# Patient Record
Sex: Male | Born: 1975 | Race: White | Hispanic: No | Marital: Single | Smoking: Never smoker
Health system: Southern US, Academic
[De-identification: ages and names within clinical notes are randomized; demographics above are authoritative.]

## PROBLEM LIST (undated history)

## (undated) DIAGNOSIS — E7800 Pure hypercholesterolemia, unspecified: Secondary | ICD-10-CM

## (undated) DIAGNOSIS — I10 Essential (primary) hypertension: Secondary | ICD-10-CM

## (undated) DIAGNOSIS — J189 Pneumonia, unspecified organism: Secondary | ICD-10-CM

## (undated) DIAGNOSIS — K7290 Hepatic failure, unspecified without coma: Secondary | ICD-10-CM

## (undated) HISTORY — DX: Pure hypercholesterolemia, unspecified: E78.00

## (undated) HISTORY — PX: HX CARPAL TUNNEL RELEASE: SHX101

## (undated) HISTORY — DX: Hepatic failure, unspecified without coma: K72.90

## (undated) HISTORY — PX: HX OTHER: 2100001105

## (undated) HISTORY — DX: Essential (primary) hypertension: I10

## (undated) HISTORY — DX: Pneumonia, unspecified organism: J18.9

## (undated) HISTORY — PX: HX EXPOSURE TO METAL SHAVINGS: 2100001401

---

## 2010-09-04 ENCOUNTER — Emergency Department (HOSPITAL_COMMUNITY): Admission: EM | Admit: 2010-09-04 | Disposition: A | Discharge status: Home / Self Care | Payer: BC Managed Care – PPO

## 2010-09-04 ENCOUNTER — Emergency Department (EMERGENCY_DEPARTMENT_HOSPITAL): Payer: BC Managed Care – PPO

## 2011-06-12 ENCOUNTER — Emergency Department (EMERGENCY_DEPARTMENT_HOSPITAL): Payer: BC Managed Care – PPO

## 2011-06-12 ENCOUNTER — Emergency Department (EMERGENCY_DEPARTMENT_HOSPITAL)
Admission: EM | Admit: 2011-06-12 | Discharge: 2011-06-12 | Payer: BC Managed Care – PPO | Attending: NURSE PRACTITIONER | Admitting: NURSE PRACTITIONER

## 2011-06-15 ENCOUNTER — Emergency Department (EMERGENCY_DEPARTMENT_HOSPITAL): Admission: EM | Admit: 2011-06-15 | Discharge: 2011-06-15 | Payer: BC Managed Care – PPO

## 2011-06-15 ENCOUNTER — Emergency Department (EMERGENCY_DEPARTMENT_HOSPITAL): Payer: BC Managed Care – PPO

## 2011-08-06 ENCOUNTER — Emergency Department (EMERGENCY_DEPARTMENT_HOSPITAL): Admission: EM | Admit: 2011-08-06 | Discharge: 2011-08-06 | Payer: BC Managed Care – PPO

## 2011-08-06 ENCOUNTER — Emergency Department (EMERGENCY_DEPARTMENT_HOSPITAL): Payer: BC Managed Care – PPO

## 2011-08-21 ENCOUNTER — Encounter (INDEPENDENT_AMBULATORY_CARE_PROVIDER_SITE_OTHER): Payer: Self-pay | Admitting: Neurological Surgery

## 2011-08-29 ENCOUNTER — Encounter (INDEPENDENT_AMBULATORY_CARE_PROVIDER_SITE_OTHER): Payer: Self-pay | Admitting: Neurological Surgery

## 2013-02-12 ENCOUNTER — Ambulatory Visit (INDEPENDENT_AMBULATORY_CARE_PROVIDER_SITE_OTHER): Payer: BC Managed Care – PPO | Admitting: Neurological Surgery

## 2013-02-12 ENCOUNTER — Encounter (INDEPENDENT_AMBULATORY_CARE_PROVIDER_SITE_OTHER): Payer: Self-pay | Admitting: Neurological Surgery

## 2013-02-12 VITALS — BP 138/86 | HR 60 | Temp 97.8°F | Resp 16 | Ht 72.0 in | Wt 235.0 lb

## 2013-02-12 DIAGNOSIS — G8929 Other chronic pain: Secondary | ICD-10-CM

## 2013-02-12 DIAGNOSIS — M545 Low back pain, unspecified: Secondary | ICD-10-CM

## 2013-02-12 DIAGNOSIS — H579 Unspecified disorder of eye and adnexa: Secondary | ICD-10-CM

## 2013-02-12 DIAGNOSIS — R200 Anesthesia of skin: Secondary | ICD-10-CM

## 2013-02-12 DIAGNOSIS — R51 Headache: Secondary | ICD-10-CM

## 2013-02-12 DIAGNOSIS — R209 Unspecified disturbances of skin sensation: Secondary | ICD-10-CM

## 2013-02-12 DIAGNOSIS — R519 Headache, unspecified: Secondary | ICD-10-CM

## 2013-02-12 DIAGNOSIS — M542 Cervicalgia: Secondary | ICD-10-CM

## 2013-02-12 DIAGNOSIS — G950 Syringomyelia and syringobulbia: Secondary | ICD-10-CM

## 2013-02-12 NOTE — H&P (Addendum)
[place] Department of Neurosurgery  New Outpatient/Consult    [name]  Date of Service: 02/12/2013  Referring physician: Max Fickle, MD  [facility]  [address]  [place], [place] [address]      Chief Complaint:   Chief Complaint   Patient presents with    Back Pain    Neck Pain    Leg Pain     bilat    Arm Pain     bilat    Numbness/Tingling Of Leg     bilat     History is provided by patient    History of Present Illness  Mr. Riley Wood is a pleasant 37 y.o. male with a past medical history of HTN, hyperlipidemia, history of liver failure in 2004 who presents today with complaints of entire spine pain since 2013, but more bothersome is recent bilateral upper and lower extremity numbness. He was found to have a syrinx in June 2013 on imaging and was recommended to follow up with repeat imaging in 4 months. The patient never followed up. He now developed the extremity numbness. Symptoms are worse on the right, and with standing or sitting. He does have lumbar pain radiating to his posterior thighs and constant burning cervicothoracic pain radiating to his interscapular region. Patient also c/o recent headaches with associated eye pain and pressure. Denies blurred vision, memory loss, confusion or disorientation. Also denies nausea or vomiting. He is having a significant amount of trouble sleeping which he is very concerned about. He takes Neurontin and motrin 800 mg for pain. He was tried on percocet and hydrocodone but states they caused side effects and were ineffective. Reports normal bowel and bladder function. Reports occasional gait instability.       Past History  Current Outpatient Prescriptions   Medication Sig    aspirin (ECOTRIN) 81 mg Oral Tablet, Delayed Release (E.C.) Take 81 mg by mouth Once a day    Gabapentin (NEURONTIN) 600 mg Oral Tablet Take 600 mg by mouth Three times a day    Ibuprofen (MOTRIN) 800 mg Oral Tablet Take 800 mg by mouth Three  times a day as needed for Pain    lisinopril (PRINIVIL) 20 mg Oral Tablet Take 20 mg by mouth Once a day    rosuvastatin (CRESTOR) 10 mg Oral Tablet Take 10 mg by mouth Once a day     No Known Allergies  Past Medical History   Diagnosis Date    High cholesterol     Hypertension     Pneumonia     Liver failure      Past Surgical History   Procedure Laterality Date    Hx carpal tunnel release      Hx exposure to metal shavings         Family History  Family History   Problem Relation Age of Onset    Cancer Multiple family members     Hypertension Multiple family members     Arthritis-osteo Father        Social History  History     Social History    Marital Status: Divorced     Spouse Name: N/A     Number of Children: N/A    Years of Education: N/A     Occupational History          Social History Main Topics    Smoking status: Never Smoker     Smokeless tobacco: Current User    Alcohol Use: Yes  Drug Use: No    Sexual Activity: Not on file     Other Topics Concern    Right Hand Dominant Yes     Social History Narrative    No narrative on file       Review of Systems:  + extremity numbness. + headaches. + neck pain. + thoracic pain. + lumbar pain. Negative for fever, weight loss, syncope, myalgias, chest pain, shortness of breath, abdominal pain, bowel or bladder incontinence, weakness and gait disturbance. All other systems reviewed are negative.     Physical Examination:   BP 138/86   Pulse 60   Temp(Src) 36.6 C (97.8 F) (Tympanic)   Resp 16   Ht 1.829 m (6')   Wt 106.595 kg (235 lb)   BMI 31.86 kg/m2  General: Patient appears stated age and in no apparent distress. Vital signs are stable. Afebrile.  Head: normocephalic, atraumatic   Eyes: Sclera non-icteric. EOMI intact  Neck: supple, no obvious masses. No JVD. No lesions.   Peripheral Vascular System: Radial pulses intact, 2/4.   Respiratory: Symmetric rise and fall of chest. No accessory muscle use.   Musculoskeletal: SLR negative  bilaterally; Patrick's sign negative bilaterally;  Lhermitte's sign negative   Neuro: Alert &  Oriented x 4. Cranial nerves grossly intact: extraocular muscles are intact, speech and language are normal and appropriate, tongue is midline and shoulders elevate symmetrically. Hearing is grossly intact. Short term, long term memory, attention span and concentration were all grossly normal.  Motor examination is 5/5 in bilateral upper extremities and 5/5 in bilateral lower extremities. Fine touch sensation is diminished in right lower and upper extremity and right foot.  DTR are 2+ and symmetric in upper and lower extremities with downgoing toes. No clonus. Negative Hoffman bilaterally.  Gait is normal. Negative babinski. Tandem is normal.      Imaging:  I reviewed the film myself and the radiology report, my interpretation is as follows: Thoracic MRI w/o contrast 08/08/11- lower cervical upper thoracic syrinx of cord  Cervical MRI with and without contrast 08/07/11- syrinx C7-T1.   MRI Brain 08/08/11 -unremarkable.     Assessment:    ICD-9-CM    1. 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 Bilateral leg numbness 782.0 MRI BRAIN W/WO CONTRAST     MRI SPINE THORACIC W/WO CONTRAST     MRI SPINE LUMBOSACRAL W/WO CONTRAST     NCS/EMG     MRI BRAIN W/WO CONTRAST     MRI SPINE THORACIC W/WO CONTRAST     MRI SPINE LUMBOSACRAL W/WO CONTRAST   3. Arm numbness bilaterally 782.0 MRI BRAIN W/WO CONTRAST     MRI SPINE CERVICAL W/WO CONTRAST     MRI SPINE THORACIC W/WO CONTRAST     NCS/EMG     MRI BRAIN W/WO CONTRAST     MRI SPINE CERVICAL W/WO CONTRAST     MRI SPINE THORACIC W/WO CONTRAST   4. Chronic lumbar pain 724.2 MRI SPINE LUMBOSACRAL W/WO CONTRAST     NCS/EMG      MRI SPINE LUMBOSACRAL W/WO CONTRAST   5. Neck pain 723.1  MRI SPINE CERVICAL W/WO CONTRAST     NCS/EMG     MRI SPINE CERVICAL W/WO CONTRAST   6. Generalized headaches 784.0 MRI BRAIN W/WO CONTRAST     MRI SPINE CERVICAL W/WO CONTRAST     MRI BRAIN W/WO CONTRAST     MRI SPINE CERVICAL W/WO CONTRAST   7. Eye pressure 379.99 MRI BRAIN W/WO CONTRAST     MRI BRAIN W/WO CONTRAST         Plan:  We need to evaluate the syrinx of his spinal cord. Also need to evaluate further for complaints of headaches, we well as bilateral upper and lower extremity numbness. Spinal cord compression is also a concern.   1. MRI Brain with and without contrast  2. MRI Cervical with and without contrast  3. MRI Thoracic with and w/o contrast  4. MRI Lumbar with and without contrast  5. EMG/NCV bilateral upper and lower extremities  6. Follow up with MRIs and EMG      Domingo Pulse, PA 02/12/2013, 1:12 PM      Patient was evaluated independently, discussed with Dr. Riley Lam whom recommends the plan above.

## 2013-02-13 ENCOUNTER — Encounter (INDEPENDENT_AMBULATORY_CARE_PROVIDER_SITE_OTHER): Payer: Self-pay | Admitting: Neurological Surgery

## 2013-02-16 ENCOUNTER — Ambulatory Visit (INDEPENDENT_AMBULATORY_CARE_PROVIDER_SITE_OTHER): Payer: Self-pay | Admitting: Neurological Surgery

## 2013-02-16 NOTE — Telephone Encounter (Signed)
 BC/BS anthem TERMINATED on 11.30.14   ezm:zmpw$MzfnczAzqnmzIZPI_zjPBogGNIeGPNDyTNjdesWpSpLliYxSz$$MzfnczAzqnmzIZPI_zjPBogGNIeGPNDyTNjdesWpSpLliYxSz$ :45 #[phone] with bc/bs  CONTACTED PATIENT will fax copy of [employer] card  Riley Wood 02/16/2013, 1:49 PM

## 2013-02-18 ENCOUNTER — Ambulatory Visit (INDEPENDENT_AMBULATORY_CARE_PROVIDER_SITE_OTHER): Payer: Self-pay | Admitting: Neurological Surgery

## 2013-02-18 NOTE — Telephone Encounter (Signed)
 Returned patient voice mail in reference to his paperwork for [employer].  I called [facility] [phone] and spoke to [place] who advised me he is still pending until he completes and sends in his paperwork along with payment.  It can take up to a week to reinstate his insurance.  Any further questions he will need to contact [employer].  Ellouise JONELLE Bruch 02/18/2013, 10:50 AM

## 2013-03-09 ENCOUNTER — Ambulatory Visit (INDEPENDENT_AMBULATORY_CARE_PROVIDER_SITE_OTHER): Payer: Self-pay | Admitting: Neurological Surgery

## 2013-03-09 NOTE — Telephone Encounter (Signed)
MRI Brain/Cervical/Thoracic/Lumbar were denied by insurance.  Reason: no new symptoms and no symptoms of Chairi.  (information was given about recent weakness/numbness & headaches)  Sent note to PA for further instruction.  Patient also notified and is contacting insurance company.  #[phone] for peer to peer  ID # [id number]

## 2013-03-13 ENCOUNTER — Encounter (INDEPENDENT_AMBULATORY_CARE_PROVIDER_SITE_OTHER): Payer: Self-pay | Admitting: Neurological Surgery

## 2013-03-13 ENCOUNTER — Ambulatory Visit (INDEPENDENT_AMBULATORY_CARE_PROVIDER_SITE_OTHER): Payer: Self-pay | Admitting: Neurological Surgery

## 2013-03-13 ENCOUNTER — Other Ambulatory Visit (INDEPENDENT_AMBULATORY_CARE_PROVIDER_SITE_OTHER): Payer: Self-pay | Admitting: Physician Assistant

## 2013-03-13 DIAGNOSIS — M545 Low back pain, unspecified: Secondary | ICD-10-CM

## 2013-03-13 DIAGNOSIS — R51 Headache: Secondary | ICD-10-CM

## 2013-03-13 DIAGNOSIS — G950 Syringomyelia and syringobulbia: Secondary | ICD-10-CM

## 2013-03-13 DIAGNOSIS — R519 Headache, unspecified: Secondary | ICD-10-CM

## 2013-03-13 DIAGNOSIS — M79604 Pain in right leg: Secondary | ICD-10-CM

## 2013-03-13 DIAGNOSIS — M79605 Pain in left leg: Secondary | ICD-10-CM

## 2013-03-13 DIAGNOSIS — Z77018 Contact with and (suspected) exposure to other hazardous metals: Secondary | ICD-10-CM

## 2013-03-13 NOTE — Telephone Encounter (Signed)
EMG scheduled with Dr. Eino Farbernavada on 1.30.15 @ 10:00 a.m. Patient to arrive 15 minutes early with medication list, insurance cards, & I.D. Waiting on further instruction regarding patient following up with Dr. Riley Lamouglas. Patient's MRI's were denied. Message sent to PA-C. Patient verbalized understanding. [name], MA 03/13/2013, 9:21 AM

## 2013-03-13 NOTE — Progress Notes (Addendum)
Performed peer to peer.  She approved a CERVICOTHORACIC JUNCTION MRI . Code # [id number]   Approval # [id number]    The rest is denied. I will also refer him to neurology for headaches. And he needs PT for lumbar before they will consider MRI Lumbar.     Follow up with MRI, EMG, needs to have completed some PT as well.    Domingo PulseHeather Johari Bennetts, PA 03/13/2013, 11:22 AM

## 2013-03-13 NOTE — Telephone Encounter (Signed)
MRI Cervicothoracic & XR Scheduled 1.11.15 @ 7:30 p.m.Marland Kitchen. Patient to arrive @ 6:30 p.m.Marland Kitchen. [facility] Spoke with Arline Aspindy When scheduling test. Will need to speak with Beckie BusingHeather PA-C prior to faxing referral for Neurology consult. Follow-up appointment will be scheduled after appointment for Neuro consult is scheduled. Patient verbalized understanding. [name], MA 03/13/2013, 1:35 PM

## 2013-03-17 ENCOUNTER — Ambulatory Visit (INDEPENDENT_AMBULATORY_CARE_PROVIDER_SITE_OTHER): Payer: Self-pay | Admitting: Neurological Surgery

## 2013-03-17 NOTE — Telephone Encounter (Signed)
Mailed patient PT slip and list of locations to have PT completed. Informed the patient that he needs to complete 4-6 weeks of PT prior to having his MRI L-Spine approved. Informed patient to have physical therapist fax his office notes to Dr. Riley Lamouglas' office at [phone]. Referral faxed to Dr. Eino FarberNavada for consult/evaluation of headaches, eye pain/pressure on 1.13.15. Riley Etienneachelle D Powell, MA 03/17/2013, 9:44 AM

## 2013-03-17 NOTE — Telephone Encounter (Signed)
Consult scheduled with Dr. Eino FarberNavada on 1.21.15 @ 8:00 a.m.Marland Kitchen. for headaches & eye pain/pressure. Patient to arrive 15 minutes early with a medication list, insurance cards, & I.D.. Riley EtienneRachelle D Powell, MA 03/17/2013, 11:23 AM

## 2013-05-05 ENCOUNTER — Ambulatory Visit (INDEPENDENT_AMBULATORY_CARE_PROVIDER_SITE_OTHER): Payer: BC Managed Care – PPO | Admitting: Neurological Surgery

## 2013-05-05 ENCOUNTER — Encounter (INDEPENDENT_AMBULATORY_CARE_PROVIDER_SITE_OTHER): Payer: Self-pay | Admitting: Neurological Surgery

## 2013-05-05 VITALS — BP 130/82 | HR 68 | Temp 98.0°F | Resp 16 | Ht 72.0 in | Wt 235.0 lb

## 2013-05-05 DIAGNOSIS — G950 Syringomyelia and syringobulbia: Secondary | ICD-10-CM

## 2013-05-05 DIAGNOSIS — M545 Low back pain, unspecified: Secondary | ICD-10-CM

## 2013-05-05 DIAGNOSIS — M546 Pain in thoracic spine: Secondary | ICD-10-CM

## 2013-05-05 DIAGNOSIS — M542 Cervicalgia: Secondary | ICD-10-CM

## 2013-05-05 NOTE — Progress Notes (Addendum)
[place] Department of Neurosurgery    [name]  Date of Service: 05/05/2013      Subjective:  Riley Wood is a pleasant 38 y.o. Male that is RH, single and between jobs currently with a past medical history of HTN, hyperlipidemia, history of liver failure in 2004 who presents today for review diagnostic studies. Entire spine pain initially started in 2013 and is progressively worsening. He was found to have small syrinx at C7-T1 in 2013. He is here for follow up on syrinx. He continues to have chronic cervicothoracic that radiates to the bilateral suprascapular regions and shoulders with pain. He also has entire hand numbness, right greater than left. He has a history of left carpal tunnel release in 2012. He also reports lumbar pain that radiates to the right hip, groin, knee and anterior right thigh with pain. He has waist down numbness after sitting on hard surfaces such as bleachers. Reports normal bowel and bladder function. He last completed PT in 2014, nothing recently. Does not follow with pain management. He follows with Dr Eino Farber, saw him this morning, he has him on amitriptyline. He also take ibuprofen 800mg  and Voltaren.     Review of Systems:  Negative for fever, weight loss, myalgias, syncope, chest pain, shortness of breath, abdominal pain, bowel or bladder incontinence, weakness and gait disturbance. + NP, thoracic pain, shoulder pain, hand numbness, LBP, RLE pain, LE numbness    Objective:  Filed Vitals:    05/05/13 1305   BP: 130/82   Pulse: 68   Temp: 36.7 C (98 F)   TempSrc: Tympanic   Resp: 16   Height: 1.829 m (6')   Weight: 106.595 kg (235 lb)     General: Patient appears stated age, in no acute distress.  Respiratory: Symmetric rise and fall of chest. No accessory muscle use.   Musculoskeletal:+ right SLR. Negative Patrick's sign  Neurological: A&O x 3. Cranial nerves 2-12 grossly intact:  extraocular muscles are intact, speech and language are normal and appropriate, tongue is  midline and shoulders elevate symmetrically. Hearing is grossly intact. Motor exam: 5/5 and symmetric in upper and lower extremities (give away weakness globally). DTRs 2+ and symmetric in upper and lower extremities, with downgoing toes. No clonus. Negative Hoffman's sign bilaterally. Sensation is diminished over RLE. + tinel at the left carpal and right cubital.       Imaging:  I personally reviewed this film and radiology report, my interpretation is as follows:  MRI thoracic 03/15/13: Small syrinx again seen at the C7-T1 level as described on the previous exam. Otherwise negative MRI of the thoracic spine. No interval change since the previous exam of August 08, 2011.  MRI cervical 03/15/13: Small syrinx at the C7-T1 level unchanged in appearance since the prior exam. Multilevel mild diffuse bulging discs extending from C3-C7. No focal disc herniation.  EMG 4 ext 04/03/13 Dr Eino Farber: mild to moderate R carpal tunnel syndrome. No cervical or lumbar radiculopathy     Assessment:    ICD-9-CM    1. 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 Cervical pain 723.1 AMB CONSULT/REFERRAL [place]     AMB CONSULT/REFERRAL PHYSIATRY-BRGPT   3. Thoracic back pain 724.1 AMB CONSULT/REFERRAL [place]     AMB CONSULT/REFERRAL PHYSIATRY-BRGPT   4. Lumbar pain with radiation down right leg 724.2 AMB CONSULT/REFERRAL [place]     AMB CONSULT/REFERRAL PHYSIATRY-BRGPT  Impression: stable syrinx at C7-T1    Plan:  [facility]   2. Referral to Physiatry, Dr Arrie AranBiundo  3. Recommend he continue follow up with Dr Eino FarberNavada  4. Return to clinic prn basis. Syrinx is stable on follow up scans.     Patient was evaluated with Dr.Douglas who recommends the plan above.    Lorann Tani Loletha Grayerlise Rusilko, PA-C 05/05/2013, 13:34  I personally saw and examined the patient. See [facility]'s note for additional details. No surgical  recommendations at this time.     Johna Rolesichard Allen Douglas, MD 05/10/2013, 16:52

## 2013-05-13 ENCOUNTER — Encounter (INDEPENDENT_AMBULATORY_CARE_PROVIDER_SITE_OTHER): Payer: Self-pay | Admitting: ANESTHESIOLOGY

## 2013-05-13 NOTE — Progress Notes (Signed)
I called patient to remind them to make sure their new patient packet paperwork is completely filled out prior to the visit. There wasn't an answer I left a message only stating that patient needs to have the four page paperwork complete prior to the scheduled clinic visit. If patient did not receive it via mail or needs help filling it out to arrive thirty minutes early or earlier to get it completed.   Riley [name]  05/13/2013, 10:54

## 2013-05-14 ENCOUNTER — Encounter (INDEPENDENT_AMBULATORY_CARE_PROVIDER_SITE_OTHER): Payer: Self-pay | Admitting: ANESTHESIOLOGY

## 2013-05-14 ENCOUNTER — Ambulatory Visit (INDEPENDENT_AMBULATORY_CARE_PROVIDER_SITE_OTHER): Payer: BC Managed Care – PPO | Admitting: ANESTHESIOLOGY

## 2013-05-14 VITALS — BP 138/94 | HR 67 | Temp 97.8°F | Resp 14 | Ht 72.0 in | Wt 240.8 lb

## 2013-05-14 DIAGNOSIS — M545 Low back pain, unspecified: Secondary | ICD-10-CM

## 2013-05-14 DIAGNOSIS — J189 Pneumonia, unspecified organism: Secondary | ICD-10-CM

## 2013-05-14 DIAGNOSIS — M542 Cervicalgia: Secondary | ICD-10-CM

## 2013-05-14 DIAGNOSIS — IMO0001 Reserved for inherently not codable concepts without codable children: Secondary | ICD-10-CM

## 2013-05-14 DIAGNOSIS — G950 Syringomyelia and syringobulbia: Secondary | ICD-10-CM

## 2013-05-14 DIAGNOSIS — M5030 Other cervical disc degeneration, unspecified cervical region: Secondary | ICD-10-CM

## 2013-05-14 DIAGNOSIS — K7290 Hepatic failure, unspecified without coma: Secondary | ICD-10-CM | POA: Insufficient documentation

## 2013-05-14 DIAGNOSIS — I10 Essential (primary) hypertension: Secondary | ICD-10-CM

## 2013-05-14 DIAGNOSIS — E7800 Pure hypercholesterolemia, unspecified: Secondary | ICD-10-CM

## 2013-05-14 DIAGNOSIS — K769 Liver disease, unspecified: Secondary | ICD-10-CM

## 2013-05-14 DIAGNOSIS — M546 Pain in thoracic spine: Secondary | ICD-10-CM

## 2013-05-14 DIAGNOSIS — M7918 Myalgia, other site: Secondary | ICD-10-CM

## 2013-05-14 MED ORDER — METHOCARBAMOL 750 MG TABLET
750.00 mg | ORAL_TABLET | Freq: Three times a day (TID) | ORAL | Status: AC | PRN
Start: 2013-05-14 — End: ?

## 2013-05-14 NOTE — Patient Instructions (Signed)
HOSPITAL PRE PROCEDURE INSTRUCTIONS    PATIENT NAME: Riley Wood   DATE SCHEDULED:   TIME TO REPORT TO [facility] (2ND FLOOR-OUT -PATIENT SURGERY):     ###YOU WILL NEED SOMEONE TO DRIVE YOU HOME FOLLOWING YOUR                                                         PROCEDURE###    PRIOR TO PROCEDURE:    Nothing to eat or drink for five (5) hours prior to procedure.    Bring a list of medications with you to the hospital.    Take medications as usual with the exception of any and all blood thinners and anti-inflammatory medications. (Check your medications).    Notify your doctor's office if any changes develop, such as temperature elevation, nausea, vomiting, diarrhea or infection. You cannot have the procedure if you are on an antibiotic.    If you need to cancel your procedure, please call [phone] between 8:30 a.m. And 4:00 p.m.    Since your procedure will be performed in an "operating room-like setting, family members will not be permitted to accompany you during your procedure.    AFTER YOUR PROCEDURE:  Call your physician's office if any problem develops. Call for temperature greater than 101 degrees.    You may experience weakness, tingling or heavy feelings in your legs the first few hours after your procedure, requiring you to be cautious when ambulating.    You may also experience a temporary increase in the level of your pain.    Your FOLLOW-UP OFFICE appointment is:Marland Kitchen.  (If for any reason you must cancel your procedure, the follow-up will be cancelled).   Due to routine paperwork, please arrive 1/2 hour prior to office appointment time.  [facility]    We are pleased that you and your physician [facility]. We encourage you to ask questions and let any of our staff know of your special needs.  In order for us to provide you with a safe and comfortable surgical experience, please read and follow the instructions below:     Do no  eat or drink anything after midnight on the night before or the morning of your surgery.  This includes water, gum, mints, hard candy, or tobacco products.  You may brush your teeth, gargle, but do not swallow any water on the morning of surgery.  If you eat or drink, your surgery may be cancelled.     Do not smoke the night before or day of your surgery.    Notify your physician if you develop any symptoms of infection.  Report high fever, chills, aches, sweats, sore throat, any chest pains or difficulty breathing.   You may shower the night before or morning of surgery.   Wear loose fitting or comfortable clothing.  You and your family member may want to bring a sweater for comfort.   Do not apply any lotion or make-up the day of your procedure.   Please leave all jewelry at home.  If you have any body piercing, please remove them at home.  If you wear glasses, contacts, or hearing aids, please bring a case for their safekeeping.   Arrive at the surgery center at your appointed time.  Late arrival may necessitate cancellation of your surgery.  A staff member will be calling you the day before your procedure after 2 pm. to notify you of your arrival time for surgery.     Please arrange for someone to drive you home.  For your safety, you will not be permitted to take a public conveyance or to drive yourself home.  You should not drive or operate machinery for 24 hours after sedation or anesthesia.   A responsible adult must be present to accompany you to your home.  We strongly suggest that all patients have an adult at home with them for the first 24 hours after surgery.   One parent or legal guardian must stay in the facility at all times while their child is in the surgery center.  Parents or guardians should observe children closely upon return home.  If you have other children you may want to make baby sitting arrangements for them.   Special medication instructions: Please only take your blood  pressure, heart pill, water pill, breathing medicine or seizure medication the morning of your procedure wit a small sip of water.   Diabetics: Do not take your diabetes pills or insulin the day of your procedure.   If you take blood thinners such as aspirin, plavix, coumadin, NSAIDS, or Vit E.  Your doctor will notify you if you need to be off these medications a few days prior to your surgery.   Remove acrylic nails and polish.  Welcome to [facility]!    Once you are scheduled for your surgery someone from the physician's office will give you a packet of information to review.  You will complete the Personal Medical History and Medication form in the physician's office or in the express test area when you have any blood work or diagnostic tests completed.  A pre-anesthesia testing (PAT) nurse will review your history and medication with you at that time.  If you get your blood work or diagnostic tests completed at another facility please fill out the forms and leave them at the physician's office so that the forms can be faxed to the PAT department for a nurse to review.  The PAT nurse may call you if they have any questions.    On the day of your procedure please come to the main entrance of the hospital and check in with one of the greeters, they will be dressed in a blue shirt and black pants.  They will direct you to express test if you require any diagnostic tests immediately prior to your procedure, or direct you to the elevator to be taken to the second floor and proceed straight ahead to Outpatient Surgery Registration desk.  Once you arrive at the Outpatient Surgery area the nurse will take you back to your room to get you ready for your surgical procedure.  The Nurse will obtain your blood pressure, temperature, respirations and oxygen saturation levels.  The Nurse ill also review your mediation list with you and note the last time your medications have been taken.  You will be asked to  change in to a hospital gown and may be asked to apply surgical stockings to your legs if your physician has ordered them.  The Nurse will then start your IV, (this IV will be used for any pre-operative medications and for your anesthesia).  The Nurse will also review your history with you to make sure there are no changes in your condition since the  Pre-admission nurse talked with you.  The anesthesiologist will then come and review your history with you.  If you have any questions about anesthesia you may direct them towards your anesthesiologist.  After all of your information has been gathered, your IV started and anesthesia has seen you then you will be ready for your surgery.  You will only be allowed to have one family member or friend with you until the anesthesia doctor has seen you and then the rest of your family or friends may come back and see you before you go to surgery.                                                    MEDICATIONS THAT MUST BE HELD:       Medications that must be held for procedures and the minimum time of holding medications:                                           MEDICATION                HOLD  THE FOLLOWING BLOOD THINNERS:  Aggrastat, Agrylin, Aggrenox, Alcis, BC Powder, Bufferin,  Celebrex    DayPro, Excedrin, Flolan, Fragmin, Indocin (Indomethacin)    Integrilin, Persantine, Pletal, ReoPro    SUPPLEMENTS  Fish Oil (Lovaza), Garlic, Ginkgo, or Ginseng, Vitamin E (over   400 IU)              7 Days   NON-STEROIDAL ANTI-INFLAMMATORY  COMPOUNDS SUCH AS:  Advil (Ibuprofen), Aleve (Naproxen), Ketoprofen (Orudis), Lodine (Etodolac), Midol (Ibuprofen),    Mobic (Meloxicam), Motrin (Ibuprofen), Relafen, Sulindac, Voltaren (Diclofenac)          4 Days      Coumadin, Pradaxa, Xarelto          5 Days     Eliquis, Apixaban      4 Days      Innohep          4 Days      Orgaran          4 Days       Embrel         14 Days      Plavix, Effient    **Hold time may be longer based on  Physician's discretion       7 Days     Ticlid     14 Days     Any and all Cold/Sinus Medicine                                             Contact Office         **Do not stop your ASPIRIN

## 2013-05-19 ENCOUNTER — Ambulatory Visit (INDEPENDENT_AMBULATORY_CARE_PROVIDER_SITE_OTHER): Payer: Self-pay | Admitting: ANESTHESIOLOGY

## 2013-05-19 NOTE — Telephone Encounter (Signed)
SHAYNA-->LM 05-15-13,05-18-13  Scheduled 06/02/13 at 1pm - aryssaert 05/15/13    [place] BC/BS anthem no auth req [email or web address]    Informed patient of injection date and time,mailed information. Osvaldo HumanShayna Tobe SosLea Forshey  05/19/2013, 10:55

## 2013-05-22 NOTE — H&P (Signed)
See dictated note.

## 2013-05-22 NOTE — H&P (Signed)
[facility]  [place] Department of Neurosurgery  [address]  [place], [place] [id number]      HISTORY AND PHYSICAL    PATIENT NAME: Riley Wood, Riley Wood  CHART NUMBER: [id number]  DATE OF BIRTH: May 29, 1975  DATE OF SERVICE: 05/14/2013    [facility]  [address], [address]  [place], [place] [id number]  Telephone: [phone]   Fax:  [phone]       REFERRING PHYSICIAN:  Johna Rolesichard Allen Douglas MD.    PRIMARY CARE PHYSICIAN:  Tarri GlennJoseph Lutz PA-C.      CHIEF COMPLAINT:  Low back pain.    HISTORY OF PRESENT ILLNESS:  Riley Wood is a pleasant 38 year old male who presents to the [facility] for initial evaluation of above stated pain complaint as referred by Dr. Riley Lamouglas.  The patient reports that he does have low back and upper back pain, which began in 2013.  He denies any trauma or inciting events, stating that the pain began although it was while he was working.  The pain is also worse when lifting.  He does have some numbness and tingling into the hands, although has been diagnosed with carpal tunnel syndrome by Dr. Eino FarberNavada.  He describes this pain as stabbing, sharp, burning and hot.  It is made worse with lifting, lying down, sitting, weather or temperature changes and better with rest, lying down and medication.  Currently, his pain is a 2/10 with typical range of 2-9/10 on the verbal numeric rating scale.  The pain makes him feel angry and he also reports problems coping.  The pain interferes quite a bit with sleep, moderately with going to work and physical exercise, a little bit with yardwork or shopping, socializing with friends, recreational activities or hobbies and appetite and not at all with having sexual relations.  He has previously trialed Voltaren, ibuprofen and gabapentin.  He otherwise has not had any other interventions.  His most effective treatment for the pain thus far has been relaxing.    Past Medical History      Diagnosis Date    High cholesterol     Hypertension     Pneumonia     Liver failure      Past Surgical History   Procedure Laterality Date    Hx carpal tunnel release       LEFT    Hx exposure to metal shavings      Hx other       RIGHT WRIST     Outpatient Encounter Prescriptions as of 05/14/2013   Medication Sig Dispense Refill    amitriptyline (ELAVIL) 10 mg Oral Tablet Take 10 mg by mouth Every night        aspirin (ECOTRIN) 81 mg Oral Tablet, Delayed Release (E.C.) Take 81 mg by mouth Once a day        diclofenac sodium (VOLTAREN) 75 mg Oral Tablet, Delayed Release (E.C.) Take 75 mg by mouth Three times a day        Gabapentin (NEURONTIN) 600 mg Oral Tablet Take 600 mg by mouth Three times a day        Ibuprofen (MOTRIN) 800 mg Oral Tablet Take 800 mg by mouth Three times a day as needed for Pain        lisinopril (PRINIVIL) 20 mg Oral Tablet Take 20 mg by mouth Once a day        methocarbamol (ROBAXIN) 750 mg Oral Tablet  Take 1 Tab (750 mg total) by mouth Three times a day as needed  90 Tab  1    rosuvastatin (CRESTOR) 10 mg Oral Tablet Take 10 mg by mouth Once a day         No facility-administered encounter medications on file as of 05/14/2013.     No Known Allergies    PSYCHOSOCIAL HISTORY:  Currently, the patient works full-time as a Veterinary surgeon.  He is divorced and currently single with 1 child, age 42.  Highest level of education completed was twelfth grade.  Currently lives alone.  He does admit to using tobacco, one can per day for 25 years and he does drink alcohol socially for the past 20 years.  He denies any illicit substance use.    FAMILY HEALTH HISTORY:  The patient's father is deceased secondary to mesothelioma.  His mother is alive and in relatively good health.  He does admit to a family history of cancer and chronic pain problems, although denies diabetes or psychiatric problems.  Family History   Problem Relation Age of Onset    Hypertension Multiple family members      Arthritis-osteo Father     Lung Cancer Father      REVIEW OF SYSTEMS:  Thirteen systems are reviewed and are positive as follows:  Sleep problems, hearing problems, arthritis, muscle weakness.    PHYSICAL EXAMINATION:    Filed Vitals:    05/14/13 1311   BP: 138/94   Pulse: 67   Temp: 36.6 C (97.8 F)   TempSrc: Tympanic   Resp: 14   Height: 1.829 m (6')   Weight: 109.226 kg (240 lb 12.8 oz)   SpO2: 98%   PainSc:   2   PainLoc: Neck   Body mass index is 32.65 kg/(m^2).  General: 38 year old male resting in a seated position in NAD.  Demonstrates no pain behavior, symptom magnification or drug-seeking behavior.  Speech is fluent and affect is normal.  HEENT: The patient is normocephalic and atraumatic. EOMI.  Cardiovascular: HRRR. Bilateral radial pulses are intact and regular.  Pulmonary: Lungs CTAB. No increased work of breathing and no accessory muscle use noted.  Abdomen: Soft, non tender and non distended.  Lymphatic System: No adenopathy identified.                                                                               Skin: Warm, dry.  Neurological/musculoskeletal examination:  The patient does have 5/5 muscle strength in the bilateral upper and lower extremities with the exception of some giveway weakness distally.  Sensation is grossly intact in all major dermatomes in the bilateral upper and lower extremities to light touch with the exception of decreased sensation in a glove distribution in the right hand.  Lumbar range of motion is full, although the patient is noted to have increased pain with extension.  Hoffmann is negative bilaterally.  Gait is nonantalgic and station is neutral.    DATA REVIEWED:  1.  MRI of the thoracic spine dated March 15, 2013 reveals a small syrinx at C7-T1.  Otherwise, negative.    2.  MRI of the cervical spine dated March 15, 2013 reveals a  small syrinx at C7-T1 without evidence of Chiari malformation.  Mild disk bulging is seen from C3 to C7 causing mild anterior  impression on the thecal sac, which are unchanged since prior exam.    ASSESSMENT:  1.  Myofascial pain syndrome.  2.  Cervical degenerative disk disease.  3.  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  4.  History of carpal tunnel syndrome.  5.  Medical comorbidities including hypertension, history of pneumonia.  6.  Prior treatment failures include Voltaren, ibuprofen and gabapentin.    PLAN:  My impression and treatment recommendations were discussed in detail with the patient who verbalized understanding and all of his questions were answered.  1.  Trigger point injections. This procedure, its benefits, risks and reasonable alternatives were discussed in detail with the patient, who verbalized understanding, and all questions were answered.  Possible risks of the procedure discussed include but are not limited to bleeding, infection, nerve irritation, nerve damage, pneumothorax, failure of pain to improve, worsening of pain, reactions to medications administered and headache.  The patient verbalized understanding of the above and wished to proceed.  I will have the patient schedule this at their earliest convenience.  2.  Robaxin 750 mg p.o. t.i.d. p.r.n. muscle spasm.  Possible risks and side effects of this medication were reviewed in detail with the patient who verbalized understanding and is agreeable to trialing this medication.  He is to contact this office should he have any questions or concerning side effects.  The patient verbalized understanding of the above plan and is agreeable to this plan.    Thank you very much for the courtesy of this kind referral.  Please do not hesitate to call should you have any questions or concerns regarding this patient's care.      Ishaq Maffei Marilu Favre, DO  Assistant Professor, [facility] Department of Neurosurgery  [facility], [place], [place], [place] [address]    UE/AV/[id number]; D: 05/22/2013 10:29:52; T: 05/22/2013 14:39:05    cc: Tarri Glenn PA-C      [address]         [place], [place] [address]            Gerlene Burdock Farris Has MD,FAANS, FACS      Shirleen Schirmer

## 2013-06-01 ENCOUNTER — Telehealth (INDEPENDENT_AMBULATORY_CARE_PROVIDER_SITE_OTHER): Payer: Self-pay | Admitting: ANESTHESIOLOGY

## 2013-06-01 NOTE — Telephone Encounter (Signed)
I called patient to remind them about their procedure. I reminded patient what time the procedure is and what time to report to the 2nd floor of [facility]-1 hour prior to procedure time. I ask if they have stopped their medications (if any). I remind them 5 or 6 hours NPO prior to procedure, to bring a list of medication(s) with them (on the form we supplied them with), and to have a driver. I answered any questions they might of had, and made sure patient understands where to go and what to do.

## 2013-06-02 ENCOUNTER — Encounter (HOSPITAL_BASED_OUTPATIENT_CLINIC_OR_DEPARTMENT_OTHER): Payer: BC Managed Care – PPO | Admitting: ANESTHESIOLOGY

## 2013-06-02 DIAGNOSIS — IMO0001 Reserved for inherently not codable concepts without codable children: Secondary | ICD-10-CM

## 2013-06-03 ENCOUNTER — Ambulatory Visit (INDEPENDENT_AMBULATORY_CARE_PROVIDER_SITE_OTHER): Payer: BC Managed Care – PPO | Admitting: Physical Medicine & Rehabilitation

## 2013-08-07 ENCOUNTER — Encounter (INDEPENDENT_AMBULATORY_CARE_PROVIDER_SITE_OTHER): Payer: BC Managed Care – PPO | Admitting: ANESTHESIOLOGY

## 2020-04-19 ENCOUNTER — Encounter (INDEPENDENT_AMBULATORY_CARE_PROVIDER_SITE_OTHER): Payer: Self-pay

## 2022-12-31 IMAGING — MR MRI LUMBAR SPINE WITHOUT CONTRAST
5 of 6 series · 32 of 48 positions shown · IV contrast (gadolinium)
Comparison: None available.

﻿EXAM:  [id number]   MRI LUMBAR SPINE WITHOUT CONTRAST
INDICATION: Persistent low back pain. Tingling and numbness of both lower extremities.  No prior back surgery.
TECHNIQUE: Multiplanar, multisequential MRI of the lumbosacral spine was performed without gadolinium contrast.

[Series 5: T2 · sagittal · 4.0mm · 0.94mm/px · 6 of 13 slices shown (1 of 3)]
[im 1/13]
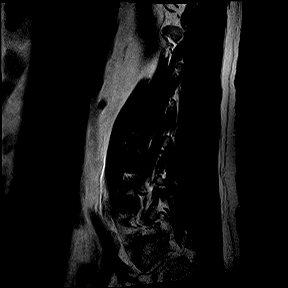
[im 3/13]
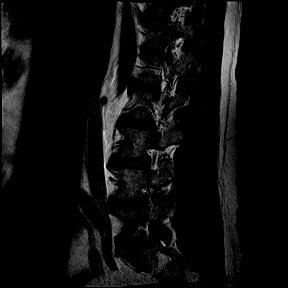
[im 5/13]
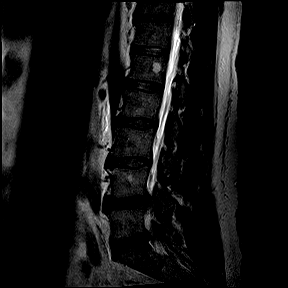
[im 8/13]
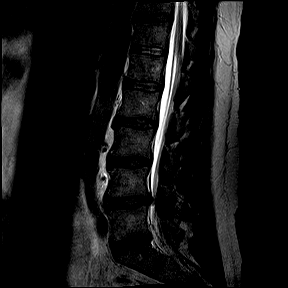
[im 10/13]
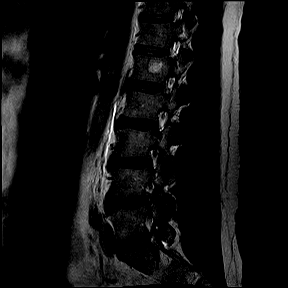
[im 13/13]
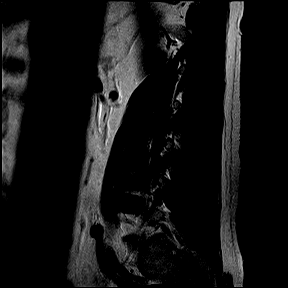

[Series 6: T1 · sagittal · 4.0mm · 0.94mm/px · 6 of 13 slices shown (1 of 2)]
[im 1/13]
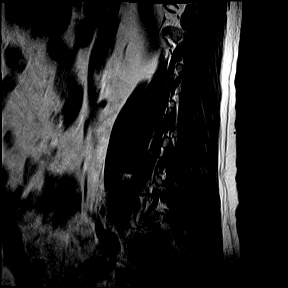
[im 3/13]
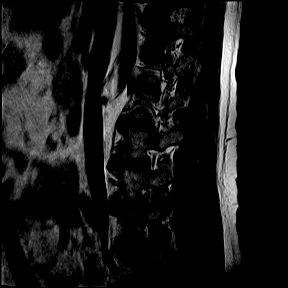
[im 5/13]
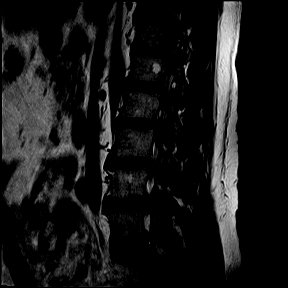
[im 8/13]
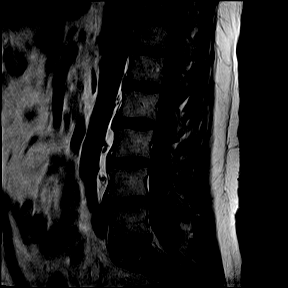
[im 10/13]
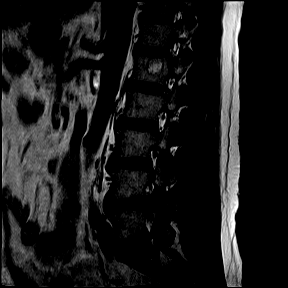
[im 13/13]
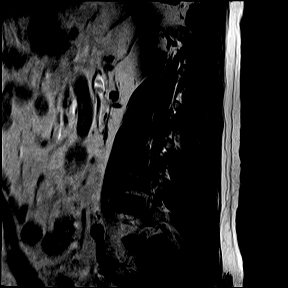

[Series 8: T2 · coronal · 5.0mm · 0.82mm/px · 8 of 18 slices shown (2 of 3)]
[im 1/18]
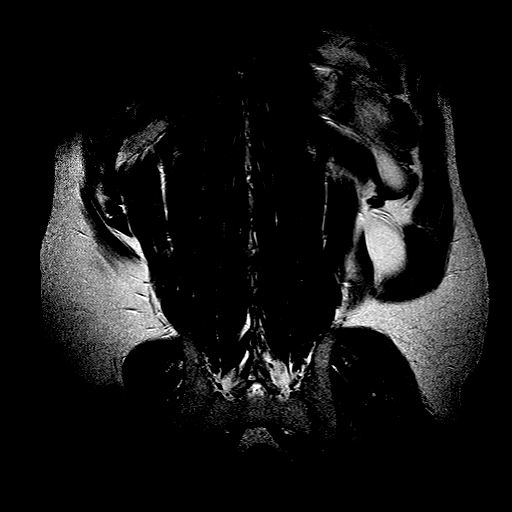
[im 3/18]
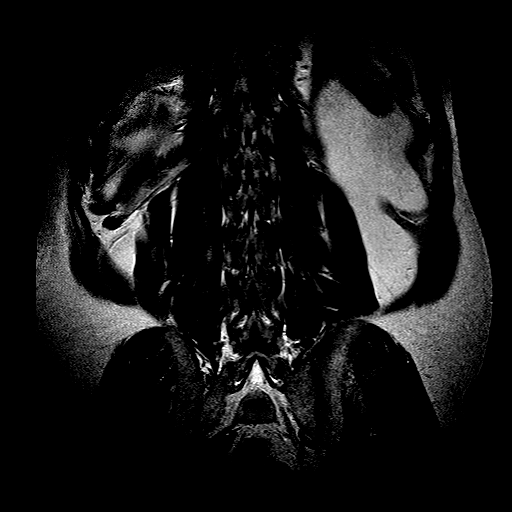
[im 5/18]
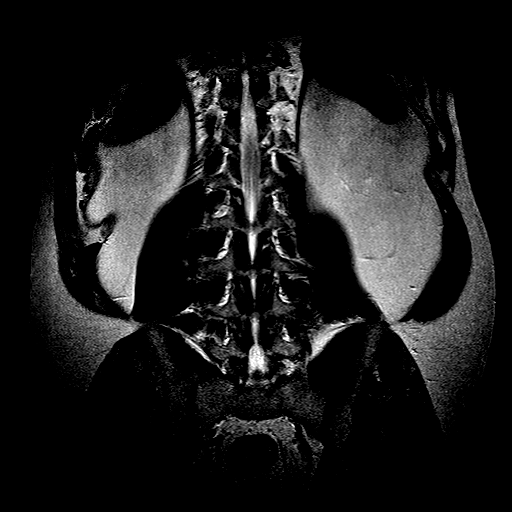
[im 8/18]
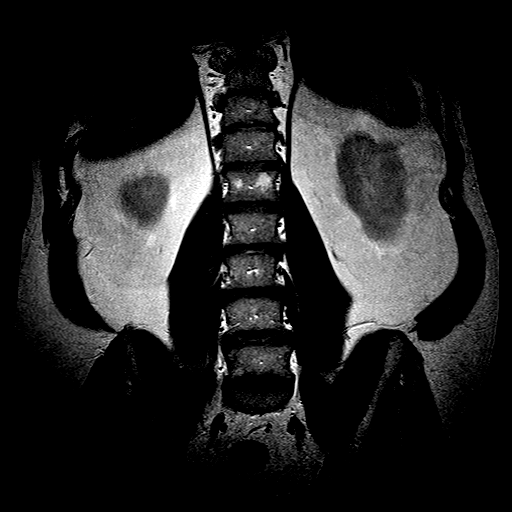
[im 10/18]
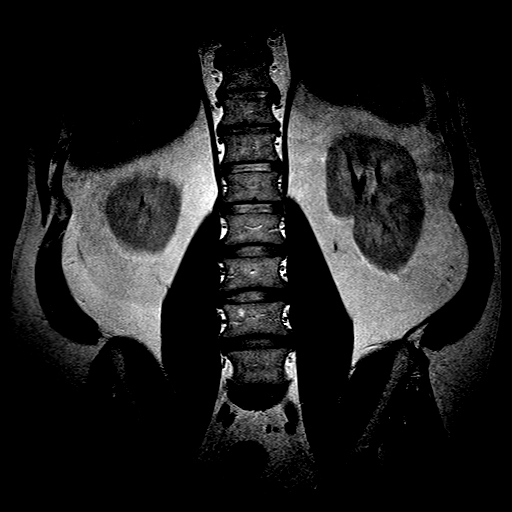
[im 13/18]
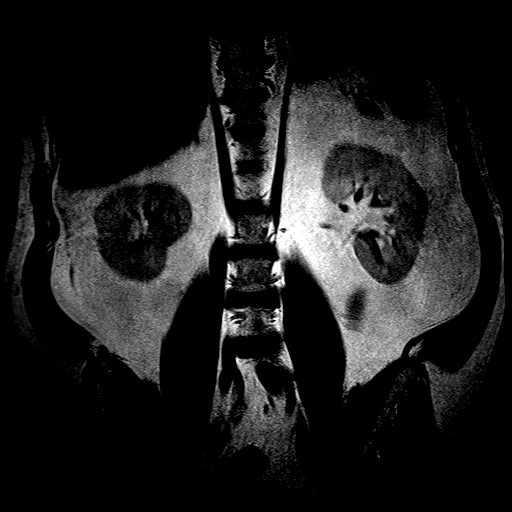
[im 15/18]
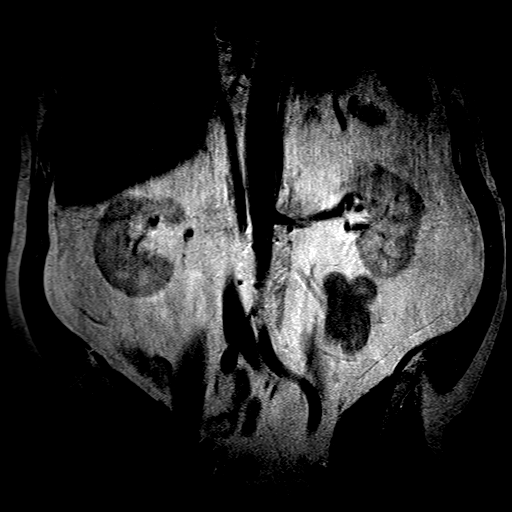
[im 18/18]
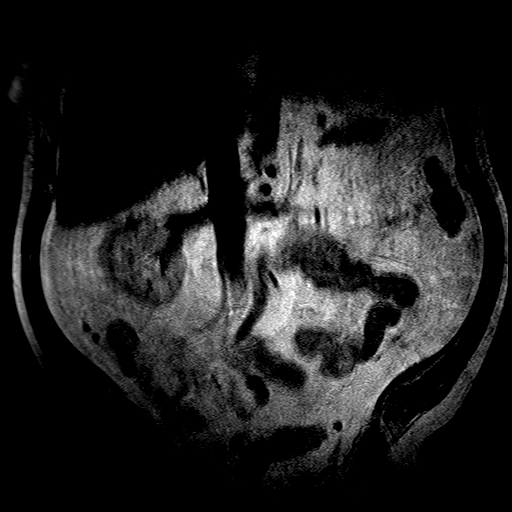

[Series 9: T2 · axial · 4.0mm · 0.52mm/px · z∈[-144,+94]mm · 11 of 25 slices shown (3 of 3)]
[im 1/25]
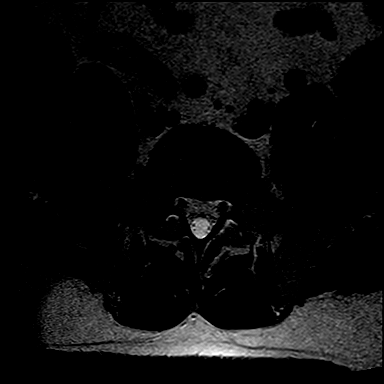
[im 3/25]
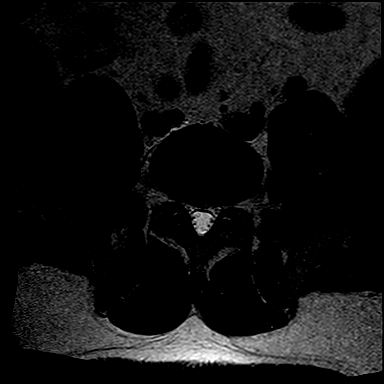
[im 5/25]
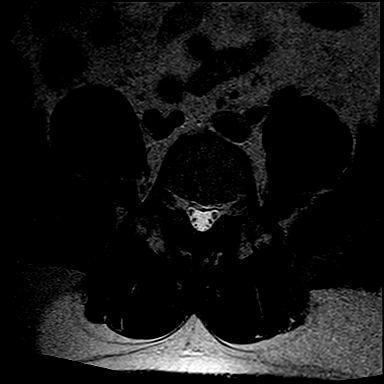
[im 8/25]
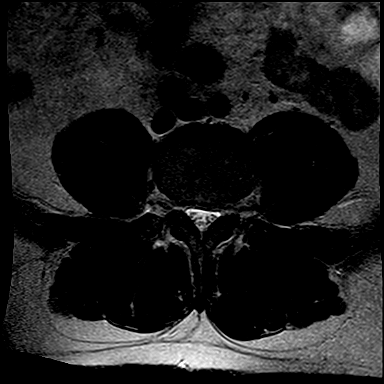
[im 10/25]
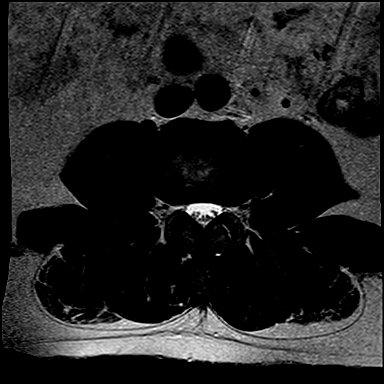
[im 13/25]
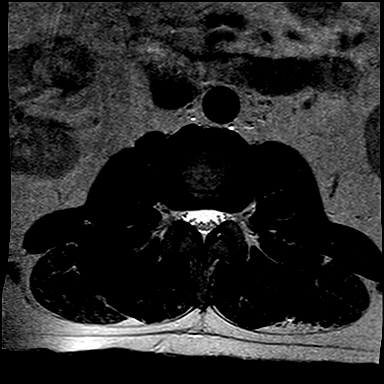
[im 15/25]
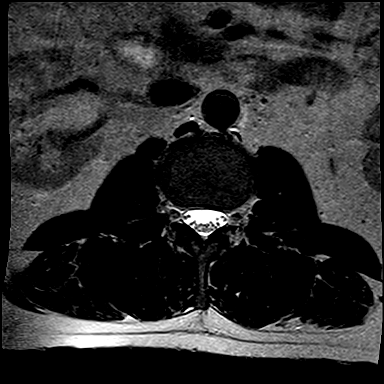
[im 17/25]
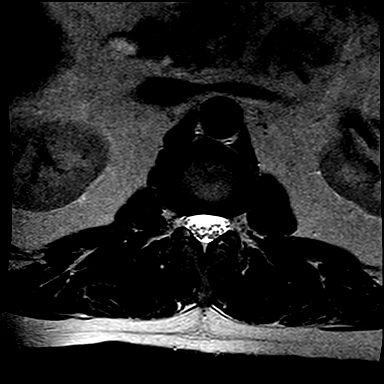
[im 20/25]
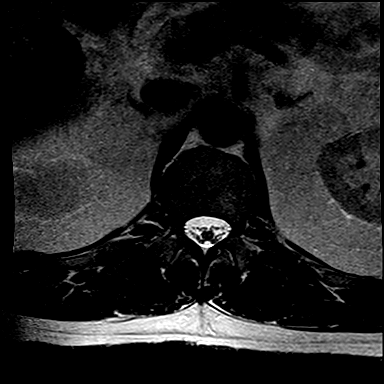
[im 22/25]
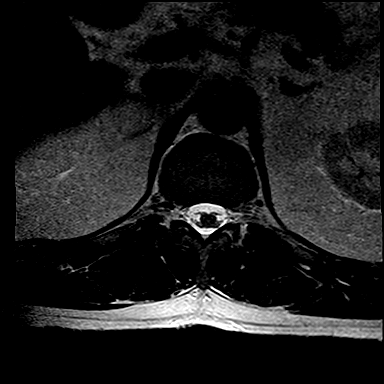
[im 25/25]
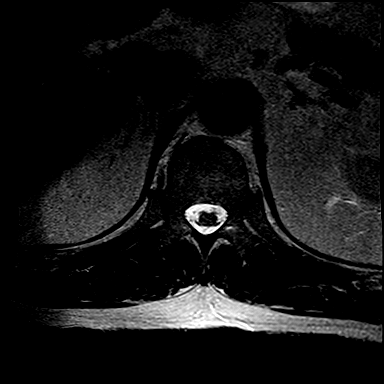

[Series 10: T1 · axial · 4.0mm · 0.52mm/px · 1 of 25 slices shown (2 of 2)]
[im 1/25]
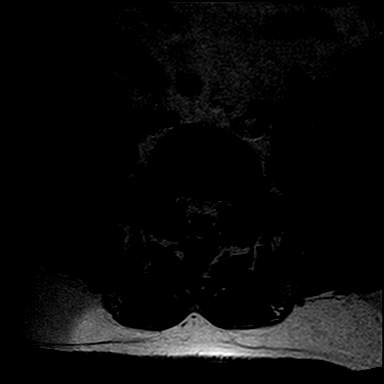

[32 of 48 positions shown; findings below may reference images not displayed]

FINDINGS: No acute or focal bone changes of lumbar vertebrae are seen.  Conus terminates at L1-2 level. 

At L1-2 level, no focal disc lesions are seen.

L2-3 disc shows mild degenerative disc changes and facet arthropathy causing mild compromise of both lateral recess and thecal sac.  Similar finding is noted at L3-4 level. 

At L4-5 level, significant degenerative disc disease with bulging annulus and severe facet arthropathy are noted.  Significant compromise of both lateral recess and neural foramina are noted along with significant compromise of thecal sac with AP diameter in the midline measuring 5 mm at L4-5 level.

At L5-S1 level, degenerative disc disease and facet arthropathy are causing moderately significant biforaminal narrowing.

Paravertebral soft tissues are unremarkable.
IMPRESSION: 1. No acute bone changes of lumbar vertebrae. 

2. At L4-5 level, significant degenerative disc disease with bulging annulus and severe facet arthropathy are noted.  Significant compromise of both lateral recess and neural foramina are noted along with significant compromise of thecal sac with AP diameter in the midline measuring 5 mm at L4-5 level.

3. Findings at other disc levels are described above in detail.

Electronically Signed by ELI, M HAIDAR at [phone] [DATE]

## 2023-02-12 ENCOUNTER — Other Ambulatory Visit: Payer: Self-pay

## 2023-02-12 ENCOUNTER — Ambulatory Visit: Payer: BC Managed Care – PPO | Attending: Neurological Surgery | Admitting: Neurological Surgery

## 2023-02-12 ENCOUNTER — Encounter (INDEPENDENT_AMBULATORY_CARE_PROVIDER_SITE_OTHER): Payer: Self-pay | Admitting: Neurological Surgery

## 2023-02-12 VITALS — BP 158/104 | HR 96 | Ht 72.0 in | Wt 236.0 lb

## 2023-02-12 DIAGNOSIS — M5417 Radiculopathy, lumbosacral region: Secondary | ICD-10-CM

## 2023-02-12 DIAGNOSIS — M5410 Radiculopathy, site unspecified: Secondary | ICD-10-CM | POA: Insufficient documentation

## 2023-02-12 DIAGNOSIS — M48061 Spinal stenosis, lumbar region without neurogenic claudication: Secondary | ICD-10-CM | POA: Insufficient documentation

## 2023-02-12 MED ORDER — GABAPENTIN 300 MG CAPSULE
300.0000 mg | ORAL_CAPSULE | Freq: Every evening | ORAL | 0 refills | Status: AC
Start: 2023-02-12 — End: 2023-03-14

## 2023-02-12 NOTE — H&P (Signed)
NEUROSURGERY[facility]  [address]  [place] [address]  Operated by [facility]  History & Physical    Name: Riley Wood MRN:  [id number]   Date: 02/12/2023 Age: 47 y.o.       Subjective:     Patient ID:   Riley Wood is an 47 y.o. male.      Chief Complaint:      Chief Complaint   Patient presents with    New Patient     47 yr old male new patient here for numbness and tingling with low back pain down left leg and some neck pain. Pt rates his pain a 9. No previous hx spine surgery. Imaging [facility], pt brought DISC. No hardware in place.        The patient is here today secondary to complaints of the about 6 months of worsening left lower extremity numbness tingling and low back pain that extends into the foot.  The patient does get pain with ambulation and does complain of some pain with sitting and pain in the SI joint region as well.  This distribution is more of a left L5 distribution with the patient has not had recent conservative treatment.  He has a some treatment in the past but that was about 10 years ago.  Does take some medications including tramadol prednisone and he has had Neurontin in the past but not recently.  This does affect his functional status and although he does still work it does affect his performance because of the pain that he gets and he had left lower extremity        Past Medical History:   Diagnosis Date    High cholesterol     Hypertension     Liver failure (CMS HCC)     Pneumonia       Past Surgical History:   Procedure Laterality Date    HX CARPAL TUNNEL RELEASE      LEFT    HX EXPOSURE TO METAL SHAVINGS      HX OTHER      RIGHT WRIST      Social History     Tobacco Use    Smoking status: Never    Smokeless tobacco: Current     Types: Snuff    Tobacco comments:     1 CAN DAILY   Substance Use Topics    Alcohol use: Yes     Comment: SOCIAL    Drug use: No      Family Medical History:       Problem Relation (Age of Onset)     Arthritis-osteo Father    Hypertension (High Blood Pressure) Multiple family members    Lung Cancer Father           Current Outpatient Medications   Medication Sig    amitriptyline (ELAVIL) 10 mg Oral Tablet Take 1 Tablet (10 mg total) by mouth Every night (Patient not taking: Reported on 02/12/2023)    aspirin (ECOTRIN) 81 mg Oral Tablet, Delayed Release (E.C.) Take 1 Tablet (81 mg total) by mouth Once a day (Patient not taking: Reported on 02/12/2023)    atorvastatin (LIPITOR) 20 mg Oral Tablet Take 1 Tablet (20 mg total) by mouth Every evening    Cyanocobalamin-Cobamamide (B-12 PLUS) 5,000-100 mcg Sublingual Tablet, Sublingual Place under the tongue    diclofenac sodium (VOLTAREN) 75 mg Oral Tablet, Delayed Release (E.C.) Take 1 Tablet (75 mg total) by mouth Three times  a day (Patient not taking: Reported on 02/12/2023)    Gabapentin (NEURONTIN) 600 mg Oral Tablet Take 1 Tablet (600 mg total) by mouth Three times a day (Patient not taking: Reported on 02/12/2023)    Ibuprofen (MOTRIN) 800 mg Oral Tablet Take 1 Tablet (800 mg total) by mouth Three times a day as needed for Pain (Patient not taking: Reported on 02/12/2023)    lisinopril (PRINIVIL) 20 mg Oral Tablet Take 1 Tablet (20 mg total) by mouth Once a day    meloxicam (MOBIC) 15 mg Oral Tablet Take 1 Tablet (15 mg total) by mouth Once a day    metFORMIN (GLUCOPHAGE) 500 mg Oral Tablet Take 1 Tablet (500 mg total) by mouth Twice daily with food    methocarbamol (ROBAXIN) 750 mg Oral Tablet Take 1 Tab (750 mg total) by mouth Three times a day as needed (Patient not taking: Reported on 02/12/2023)    predniSONE (DELTASONE) 20 mg Oral Tablet Take 1 Tablet (20 mg total) by mouth Once a day    rosuvastatin (CRESTOR) 10 mg Oral Tablet Take 1 Tablet (10 mg total) by mouth Once a day (Patient not taking: Reported on 02/12/2023)    traMADoL (ULTRAM) 50 mg Oral Tablet Take by mouth Every 6 hours as needed for Pain       No Known Allergies     Review of  Systems:  Review of systems pertinent negatives and postives as discussed in HPI.     Objective:   Neurological Exam  Mental Status  Awake, alert and oriented to person, place and time. Oriented to person, place, time and situation. Recent and remote memory are intact. Speech is normal. Language is fluent with no aphasia. Attention and concentration are normal. Fund of knowledge is appropriate for level of education.    Cranial Nerves  CN I: Sense of smell is normal.  CN II: Visual acuity is normal. Visual fields full to confrontation.  CN III, IV, VI: Extraocular movements intact bilaterally. Normal lids and orbits bilaterally. Pupils equal round and reactive to light bilaterally.  CN V: Facial sensation is normal.  CN VII: Full and symmetric facial movement.  CN VIII: Hearing is normal.  CN IX, X: Palate elevates symmetrically. Normal gag reflex.  CN XI: Shoulder shrug strength is normal.  CN XII: Tongue midline without atrophy or fasciculations.    Motor  Normal muscle bulk throughout. Normal muscle tone. Strength is 5/5 throughout all four extremities.    Sensory  Sensation is intact to light touch, pinprick, vibration and proprioception in all four extremities.  Positive Fortin Finger Sign Left SI Joint    Provocative Maneuvers of the SI Joint     Positive Faber - Left   Positive Thigh Thrust - Left   Positive Distraction - Left   Positive Compression - Left     The patient has pain in the region of the SI Joint when sitting and standing     The patient has radicular leg pain in addition to SI joint pain   .    Reflexes                                            Right                      Left  Brachioradialis  2+                         2+  Biceps                                 2+                         2+  Triceps                                2+                         2+  Patellar                                Tr                         Tr  Achilles                                Tr                          Tr    Right pathological reflexes: Hoffmann's absent.  Left pathological reflexes: Hoffmann's absent.    Coordination    Finger-to-nose, rapid alternating movements and heel-to-shin normal bilaterally without dysmetria.    Gait  Normal casual, toe, heel and tandem gait.      Data reviewed:  Imaging:  I have reviewed the available radiographic studies including images and reports and made an independent interpretation of the findings and compared my findings to that of the interpreting radiologist.    MRI of the lumbar spine that was done at an outside shows evidence of L4-5 lumbar stenosis moderate to early severe in nature.      Labs:     Assessment & Plan:       ICD-10-CM    1. Lumbar stenosis  M48.061 Refer to Physical Therapy-External      2. Radiculopathy, unspecified spinal region  M54.10       Patient has evidence of lumbar stenosis and radiculopathy of the left lower extremity.  We are unclear if this is related to the stenosis or could be SI joint in origin as he does have provocative factors as well.  At this time we will start physical therapy as well as some gabapentin.  I would also like him to start and left L4 transforaminal epidural steroid injection for diagnostic purposes.  The gabapentin will be 300 mg at night only.  I discussed this with the patient and we can consider an SI joint injection as well depending how he responds to the transforaminal injection and he agrees with the plan of care.  We will see him back after the injection has been completed for the left L4 transforaminal    On the day of the encounter, a total of  40 minutes was spent on this patient encounter including review of historical information, examination, documentation and post-visit activities. The time documented excludes procedural time.   Leodis Rains, MD    This  note was partially generated using MModal Fluency Direct system, and there may be some incorrect words, spellings, and punctuation that  were not noted in checking the note before saving.

## 2023-05-01 ENCOUNTER — Telehealth (INDEPENDENT_AMBULATORY_CARE_PROVIDER_SITE_OTHER): Payer: Self-pay | Admitting: Neurological Surgery

## 2023-05-10 ENCOUNTER — Encounter (HOSPITAL_BASED_OUTPATIENT_CLINIC_OR_DEPARTMENT_OTHER): Payer: Self-pay | Admitting: Neurological Surgery

## 2023-05-10 ENCOUNTER — Other Ambulatory Visit: Payer: Self-pay

## 2023-05-10 ENCOUNTER — Ambulatory Visit
Admission: RE | Admit: 2023-05-10 | Discharge: 2023-05-10 | Disposition: A | Discharge status: Home / Self Care | Payer: Self-pay | Source: Ambulatory Visit | Attending: Neurological Surgery | Admitting: Neurological Surgery

## 2023-05-10 ENCOUNTER — Ambulatory Visit (HOSPITAL_COMMUNITY)

## 2023-05-10 ENCOUNTER — Encounter (HOSPITAL_BASED_OUTPATIENT_CLINIC_OR_DEPARTMENT_OTHER)
Admission: RE | Disposition: A | Discharge status: Home / Self Care | Payer: Self-pay | Source: Ambulatory Visit | Attending: Neurological Surgery

## 2023-05-10 DIAGNOSIS — M5416 Radiculopathy, lumbar region: Secondary | ICD-10-CM | POA: Insufficient documentation

## 2023-05-10 SURGERY — LUMBAR / SACRAL TRANSFORAMINAL INJECTION
Anesthesia: Local (Nurse-Monitored) | Site: Back | Laterality: Left | Wound class: Clean Wound: Uninfected operative wounds in which no inflammation occurred

## 2023-05-10 MED ORDER — IOPAMIDOL 200 MG IODINE/ML (41 %) INTRATHECAL SOLUTION
Freq: Once | INTRATHECAL | Status: DC | PRN
Start: 2023-05-10 — End: 2023-05-10
  Administered 2023-05-10: 3 mL via INTRATHECAL

## 2023-05-10 MED ORDER — TRIAMCINOLONE ACETONIDE 40 MG/ML SUSPENSION FOR INJECTION
Freq: Once | INTRAMUSCULAR | Status: DC | PRN
Start: 2023-05-10 — End: 2023-05-10
  Administered 2023-05-10: 40 mg via INTRAMUSCULAR

## 2023-05-10 MED ORDER — LIDOCAINE HCL 10 MG/ML (1 %) INJECTION SOLUTION
Freq: Once | INTRAMUSCULAR | Status: DC | PRN
Start: 2023-05-10 — End: 2023-05-10
  Administered 2023-05-10: 5 mL via INTRADERMAL

## 2023-05-10 MED ORDER — BUPIVACAINE (PF) 0.25 % (2.5 MG/ML) INJECTION SOLUTION
Freq: Once | INTRAMUSCULAR | Status: DC | PRN
Start: 2023-05-10 — End: 2023-05-10
  Administered 2023-05-10: 2.5 mL

## 2023-05-10 SURGICAL SUPPLY — 13 items
APPL 70% ISPRP 2% CHG 26ML CHLRPRP HI-LT ORNG PREP STRL LF  DISP CLR (MED SURG SUPPLIES) ×1 IMPLANT
CONV USE 85144 - NEEDLE 1.5IN 18GA FIL STRL BLUNT MONOJECT LF  DISP (MED SURG SUPPLIES) ×1 IMPLANT
DRAPE FLRSCP CARM 36X30IN EQP (DRAPE/PACKS/SHEETS/OR TOWEL) ×1 IMPLANT
GLOVE SURG 7.5 LF  PF BEAD CUF STRL CRM 11.8IN PROTEXIS PI PLISPRN THK9.1 MIL (GLOVES AND ACCESSORIES) ×1 IMPLANT
HCL STERILE STAND. LABEL PROOF # 331641 (MED SURG SUPPLIES) ×1 IMPLANT
NEEDLE HYPO  25GA 1.5IN REG WL PRCSNGL SS POLYPROP REG BVL LL HUB DEHP-FR BLU STRL LF  DISP (MED SURG SUPPLIES) ×1 IMPLANT
NEEDLE SPINAL 3.5IN 22GA SPNCN QUINCKE BPA PVC FREE DEHP-FR STRL LF (MED SURG SUPPLIES) ×1 IMPLANT
PEN SURG MRKNG DVN SKIN DISP FN TIP FLXB RLR CAP LBL GNTN VIOL STRL LF (MED SURG SUPPLIES) ×1 IMPLANT
SPONGE GAUZE 4X4IN CURITY PLASTIC COTTON 12 PLY MAX ABS TRY LF  STRL DISP (WOUND CARE SUPPLY) ×1 IMPLANT
SYRINGE LL 10ML LF  STRL CONTROL CONCEN TIP PRGN FREE DEHP-FR MED DISP (MED SURG SUPPLIES) ×1 IMPLANT
SYRINGE LL 10ML LF  STRL GRAD N-PYRG DEHP-FR PVC FREE MED DISP (MED SURG SUPPLIES) ×2 IMPLANT
SYRINGE LL 5ML LF STRL GRAD N-PYRG DEHP-FR PVC FREE MED DISP CLR (MED SURG SUPPLIES) ×2 IMPLANT
TOWEL 26X16IN COTTON BLU SAF DISP SURG STRL LF (DRAPE/PACKS/SHEETS/OR TOWEL) ×1 IMPLANT

## 2023-05-10 NOTE — OR Surgeon (Signed)
 [facility]  DEPARTMENT OF NEUROSURGERY  OPERATION SUMMARY     PATIENT NAME:  Riley Wood   DATE OF BIRTH:  1975/11/16  HOSPITAL NUMBER:  [id number]   DATE OF SERVICE:  05/10/2023      PREOPERATIVE DIAGNOSIS:  Left lumbar L4 Radiculopathy    Postoperative diagnosis:  Same    NAME OF PROCEDURES:   Left L4 transforaminal epidural steroid injection           INTRAOPERATIVE FINDINGS:  See operative detail     SURGEON:  Leodis Rains, MD  .   ASSISTANT:  None     ANESTHESIA TYPE:  Local     ESTIMATED BLOOD LOSS:  1 cc     BLOOD GIVEN:   None.     FLUIDS GIVEN:  None     COMPLICATIONS:  None.     WOUND CLASS:  None     TUBES:  None.     DRAINS:  None.     SPECIMENS/CULTURES:  None.     IMPLANTS:  None     EXPLANTS:  none     INDICATIONS FOR PROCEDURE:  [name] is a 48 y.o. male has a history of left L4 level radiculopathy was indicated for a transforaminal epidural steroid injection.    DESCRIPTION OF PROCEDURE:   The patient was brought to the operating room placed on a flat bed with appropriate padding in a prone position.  We then brought in AP fluoroscopy after the patient was prepped and draped in a sterile fashion in order to localize our injection site.  We proceeded to mark the injection site on the appropriate side for the injection.  We then infiltrated with lidocaine to numb the skin and proceeded to take a 22 gauge spine needle inserting it through skin towards the neural foramina at the appropriate level designated for this injection which was left L4.  Use a lateral fluoroscopic image to confirm our positioning within the neural foramen and once we had good images showing placement of the needle we then proceeded to go back to the AP position and used Isovue dye to confirm our location within the foramina.  After confirmation we then proceed with placement of our epidural steroid injection which was a mixture of Kenalog 40 mg and 0.25% Marcaine.  This process was repeated in  the same sequence if this was a bilateral injection.  This was injected without complication the patient tolerated the procedure well and was taken to recovery in a stable condition.      Leodis Rains, MD    05/10/2023;09:08

## 2023-05-10 NOTE — Discharge Instructions (Signed)
 Rest today.   Resume usual activity tomorrow.   May shower today.   Resume blood thinners tomorrow.
